# Patient Record
Sex: Male | Born: 1990 | Race: White | Hispanic: No | Marital: Single | Smoking: Never smoker
Health system: Southern US, Community
[De-identification: ages and names within clinical notes are randomized; demographics above are authoritative.]

## PROBLEM LIST (undated history)

## (undated) DIAGNOSIS — R519 Headache, unspecified: Secondary | ICD-10-CM

## (undated) HISTORY — PX: NO PAST SURGERIES: SHX2092

## (undated) HISTORY — DX: Headache, unspecified: R51.9

---
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
## 2013-07-31 ENCOUNTER — Emergency Department (HOSPITAL_COMMUNITY): Payer: BC Managed Care – PPO

## 2013-07-31 ENCOUNTER — Encounter (HOSPITAL_COMMUNITY): Payer: Self-pay | Admitting: Emergency Medicine

## 2013-07-31 ENCOUNTER — Emergency Department (HOSPITAL_COMMUNITY)
Admission: EM | Admit: 2013-07-31 | Discharge: 2013-07-31 | Disposition: A | Discharge status: Home / Self Care | Payer: BC Managed Care – PPO | Attending: Emergency Medicine | Admitting: Emergency Medicine

## 2013-07-31 DIAGNOSIS — K5289 Other specified noninfective gastroenteritis and colitis: Secondary | ICD-10-CM | POA: Insufficient documentation

## 2013-07-31 DIAGNOSIS — K529 Noninfective gastroenteritis and colitis, unspecified: Secondary | ICD-10-CM

## 2013-07-31 DIAGNOSIS — Z79899 Other long term (current) drug therapy: Secondary | ICD-10-CM | POA: Insufficient documentation

## 2013-07-31 LAB — BASIC METABOLIC PANEL
BUN: 16 mg/dL (ref 6–23)
CO2: 26 mEq/L (ref 19–32)
Calcium: 10.1 mg/dL (ref 8.4–10.5)
Creatinine, Ser: 0.86 mg/dL (ref 0.50–1.35)
GFR calc non Af Amer: >90 mL/min (ref >90)
Glucose, Bld: 88 mg/dL (ref 70–99)
Sodium: 137 mEq/L (ref 135–145)

## 2013-07-31 LAB — CBC
MCV: 83.9 fL (ref 78.0–100.0)
Platelets: 248 10*3/uL (ref 150–400)
RBC: 5.15 MIL/uL (ref 4.22–5.81)
RDW: 12.8 % (ref 11.5–15.5)
WBC: 12.9 10*3/uL — ABNORMAL HIGH (ref 4.0–10.5)

## 2013-07-31 LAB — HEPATIC FUNCTION PANEL
Alkaline Phosphatase: 70 U/L (ref 39–117)
Bilirubin, Direct: <0.1 mg/dL (ref 0.0–0.3)
Total Bilirubin: 0.4 mg/dL (ref 0.3–1.2)

## 2013-07-31 MED ORDER — CIPROFLOXACIN HCL 500 MG PO TABS
500.0000 mg | ORAL_TABLET | Freq: Once | ORAL | Status: AC
  Administered 2013-07-31: 500 mg via ORAL
  Filled 2013-07-31: qty 1

## 2013-07-31 MED ORDER — METRONIDAZOLE 500 MG PO TABS
500.0000 mg | ORAL_TABLET | Freq: Once | ORAL | Status: AC
  Administered 2013-07-31: 500 mg via ORAL
  Filled 2013-07-31: qty 1

## 2013-07-31 MED ORDER — METRONIDAZOLE 500 MG PO TABS: 500.0000 mg | ORAL_TABLET | Freq: Two times a day (BID) | ORAL | Status: DC

## 2013-07-31 MED ORDER — MORPHINE SULFATE 4 MG/ML IJ SOLN
4.0000 mg | Freq: Once | INTRAMUSCULAR | Status: AC
  Administered 2013-07-31: 4 mg via INTRAVENOUS
  Filled 2013-07-31: qty 1

## 2013-07-31 MED ORDER — IOHEXOL 300 MG/ML  SOLN
50.0000 mL | Freq: Once | INTRAMUSCULAR | Status: AC | PRN
  Administered 2013-07-31: 50 mL via ORAL

## 2013-07-31 MED ORDER — IOHEXOL 300 MG/ML  SOLN
100.0000 mL | Freq: Once | INTRAMUSCULAR | Status: AC | PRN
  Administered 2013-07-31: 100 mL via INTRAVENOUS

## 2013-07-31 MED ORDER — ONDANSETRON HCL 4 MG/2ML IJ SOLN
4.0000 mg | Freq: Once | INTRAMUSCULAR | Status: AC
  Administered 2013-07-31: 4 mg via INTRAVENOUS
  Filled 2013-07-31: qty 2

## 2013-07-31 MED ORDER — CIPROFLOXACIN HCL 500 MG PO TABS: 500.0000 mg | ORAL_TABLET | Freq: Two times a day (BID) | ORAL | Status: DC

## 2013-07-31 NOTE — ED Provider Notes (Signed)
CSN: [id number]     Arrival date & time 07/31/13  1304 History   First MD Initiated Contact with Patient 07/31/13 1319     Chief Complaint  Patient presents with  . Abdominal Pain   (Consider location/radiation/quality/duration/timing/severity/associated sxs/prior Treatment) Patient is a 22 y.o. male presenting with abdominal pain. The history is provided by the patient.  Abdominal Pain Pain location:  Suprapubic and RLQ Pain quality: cramping and sharp   Pain radiates to:  Does not radiate Pain severity:  Moderate Onset quality:  Gradual Timing:  Constant Progression:  Worsening Chronicity:  New Context: not alcohol use, not diet changes, not eating, not sick contacts, not suspicious food intake and not trauma   Relieved by:  Nothing Worsened by:  Nothing tried Ineffective treatments:  None tried Associated symptoms: diarrhea and nausea   Associated symptoms: no cough, no fever, no shortness of breath and no vomiting     History reviewed. No pertinent past medical history. History reviewed. No pertinent past surgical history. No family history on file. History  Substance Use Topics  . Smoking status: Never Smoker   . Smokeless tobacco: Not on file  . Alcohol Use: Yes     Comment: occasional     Review of Systems  Constitutional: Negative for fever.  Respiratory: Negative for cough and shortness of breath.   Gastrointestinal: Positive for nausea, abdominal pain and diarrhea. Negative for vomiting.  All other systems reviewed and are negative.    Allergies  Strawberry  Home Medications   Current Outpatient Rx  Name  Route  Sig  Dispense  Refill  . calcium carbonate (TUMS - DOSED IN MG ELEMENTAL CALCIUM) 500 MG chewable tablet   Oral   Chew 1 tablet by mouth as needed for heartburn.         Marland Kitchen ibuprofen (ADVIL,MOTRIN) 200 MG tablet   Oral   Take 400 mg by mouth every 6 (six) hours as needed for pain or headache.         . lisdexamfetamine (VYVANSE) 50 MG  capsule   Oral   Take 50 mg by mouth every morning.          BP 141/86  Pulse 73  Resp 17  SpO2 99% Physical Exam  Constitutional: He is oriented to person, place, and time. He appears well-developed and well-nourished. No distress.  HENT:  Head: Normocephalic and atraumatic.  Mouth/Throat: No oropharyngeal exudate.  Eyes: EOM are normal. Pupils are equal, round, and reactive to light.  Neck: Normal range of motion. Neck supple.  Cardiovascular: Normal rate and regular rhythm.  Exam reveals no friction rub.   No murmur heard. Pulmonary/Chest: Effort normal and breath sounds normal. No respiratory distress. He has no wheezes. He has no rales.  Abdominal: He exhibits no distension. There is tenderness (RLQ). There is no rebound.  Genitourinary:  Deferred  Musculoskeletal: Normal range of motion. He exhibits no edema.  Neurological: He is alert and oriented to person, place, and time.  Skin: He is not diaphoretic.    ED Course  Procedures (including critical care time) Labs Review Labs Reviewed  CBC  BASIC METABOLIC PANEL  HEPATIC FUNCTION PANEL   Imaging Review Ct Abdomen Pelvis W Contrast  07/31/2013   CLINICAL DATA:  Nausea and vomiting, onset today, right lower quadrant pain, question appendicitis  EXAM: CT ABDOMEN AND PELVIS WITH CONTRAST  TECHNIQUE: Multidetector CT imaging of the abdomen and pelvis was performed using the standard protocol following bolus administration of intravenous  contrast. Sagittal and coronal MPR images reconstructed from axial data set.  CONTRAST:  OMNIPAQUE IOHEXOL 300 MG/ML SOLN Dilute oral contrast.  COMPARISON:  Not  FINDINGS: Minimal dependent atelectasis in left lower lobe.  Liver, spleen, pancreas, kidneys, and adrenal glands normal appearance.  1.6 cm diameter splenule anterior to spleen.  Colon and distal small bowel unopacified and under distended at time of imaging.  Normal appendix.  Due to lack of adequate distention and  opacification, unable to completely exclude bowel wall thickening of the distal ileum.  Remaining bowel loops normal appearance.  No mass, adenopathy, free fluid or inflammatory process.  Bladder and ureters unremarkable.  No hernia or acute osseous findings.  IMPRESSION: No evidence of appendicitis.  Wall of the distal ileum is minimally prominent but this could be an artifact related to inadequate distention and non opacification; however, unable to exclude distal small bowel enteritis such as from infection or inflammatory bowel disease.  Remainder of exam unremarkable.   Electronically Signed   By: Ulyses Southward M.D.   On: 07/31/2013 16:34    MDM   1. Enteritis    17M with no prior medical history presents with RLQ, suprapubic pain. Denies GU symptoms. Mild associated nausea, no vomiting. Sent for concerns of appendicitis. Here, AFVSS, mild RLQ pain. Will CT scan.  Awaiting CT scan results, transfer of care to Dr. Jeraldine Loots.  Dagmar Hait, MD 08/01/13 [phone]

## 2013-07-31 NOTE — ED Provider Notes (Signed)
On repeat exam the patient appears comfortable.  He states that he feels better. I discussed the CT results with him and his mother. The mother now states that the patient has had similar episodes in the past, and has a cousin with inflammatory bowel disease. Patient has CT evidence of enteritis, and with his leukocytosis, and the mothers preference, he will be started on antibiotics.  He was discharged in stable condition to follow up with a primary care physician and gastroenterology.  Gerhard Munch, MD 07/31/13 1728

## 2013-07-31 NOTE — ED Notes (Signed)
While IB pushing morphine pt stated complaining of it making him feel worse rather than better.  Pt become pain hurting worse in abd and in his head. Stopped pushing and discarded rest of med.

## 2013-07-31 NOTE — ED Notes (Signed)
Pt states that early this morning he started having abdominal pain. Pt went to [facility] and was sent here to rule out appendicitis. Pt has had diarrhea and nausea. Pt denies vomiting.

## 2013-07-31 NOTE — ED Notes (Signed)
MD at bedside. 

## 2015-04-18 IMAGING — CT CT ABD-PELV W/ CM
1 of 2 series · 15 of 32 positions shown, 19 images · IV contrast (OMNIPAQUE 300)
Comparison: Not

CLINICAL DATA: Nausea and vomiting, onset today, right lower
quadrant pain, question appendicitis

EXAM:
CT ABDOMEN AND PELVIS WITH CONTRAST
TECHNIQUE: Multidetector CT imaging of the abdomen and pelvis was performed
using the standard protocol following bolus administration of
intravenous contrast. Sagittal and coronal MPR images reconstructed
from axial data set.
CONTRAST:  100mL OMNIPAQUE IOHEXOL 300 MG/ML SOLN Dilute oral
contrast.

[Series 2: abd/pel with · axial · 0.74mm/px · z∈[-442,-17]mm · 15 of 93 slices shown, 19 images]
[im 4/93  soft-tissue]
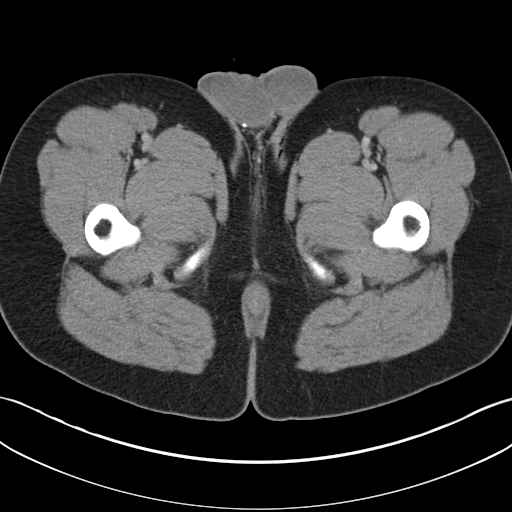
[im 4/93  bone]
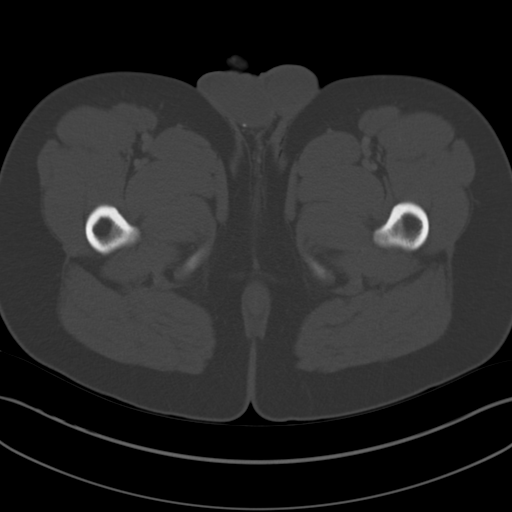
[im 12/93  soft-tissue]
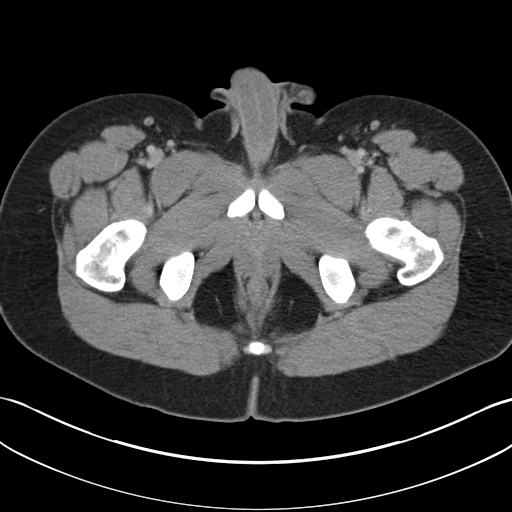
[im 20/93  soft-tissue]
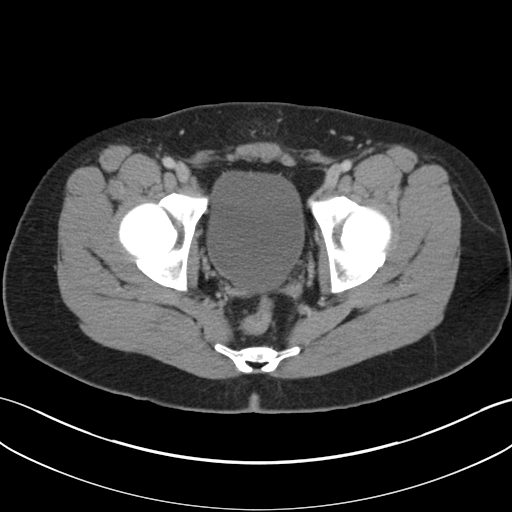
[im 27/93  soft-tissue]
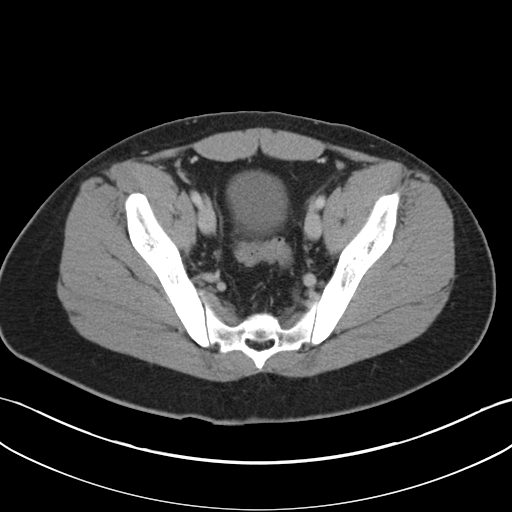
[im 31/93  soft-tissue]
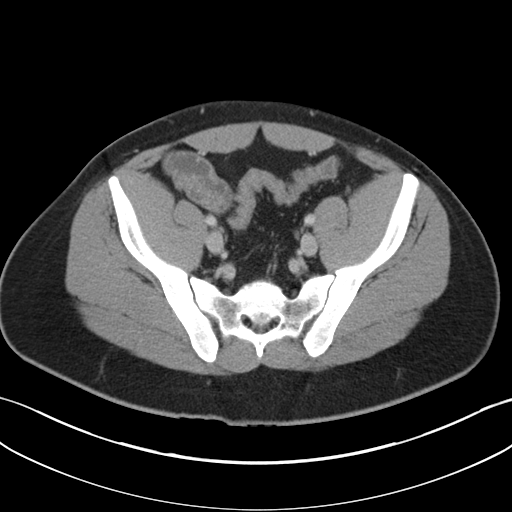
[im 39/93  soft-tissue]
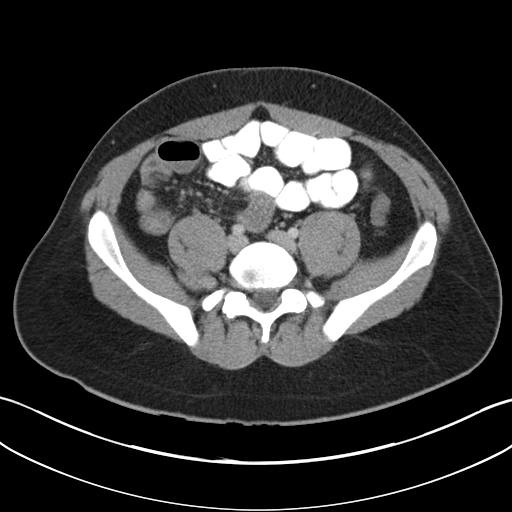
[im 47/93  soft-tissue]
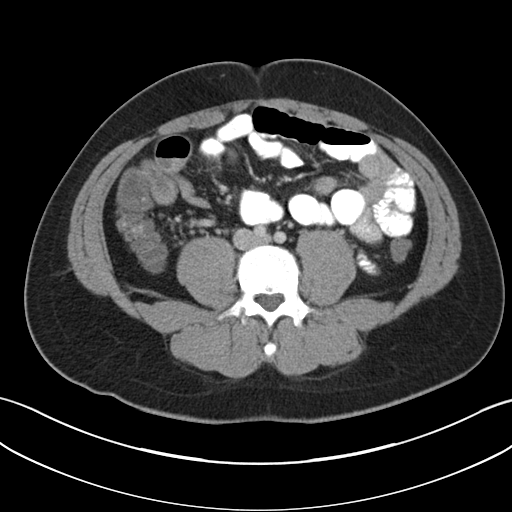
[im 54/93  soft-tissue]
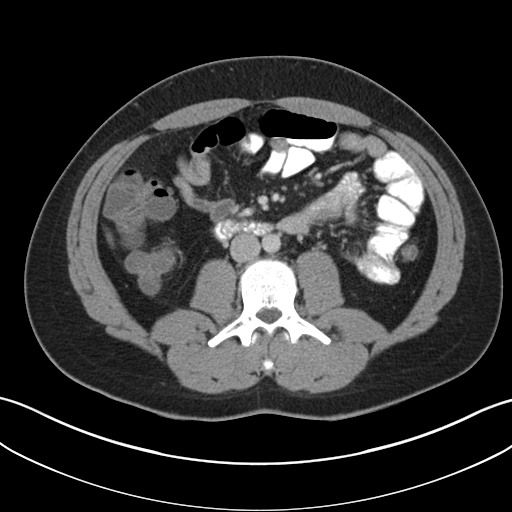
[im 62/93  soft-tissue]
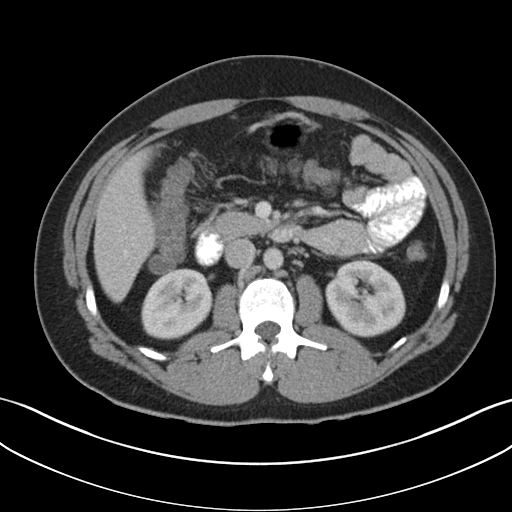
[im 62/93  bone]
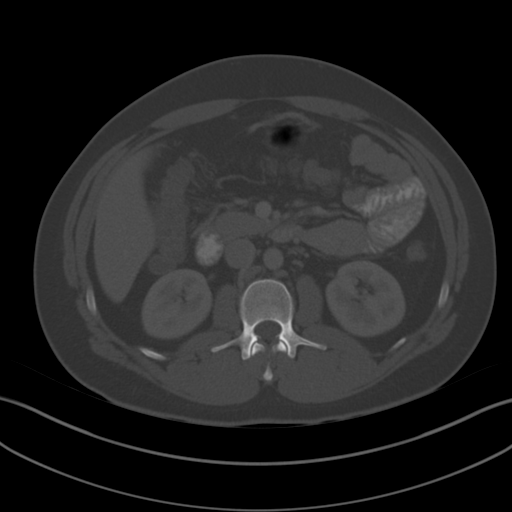
[im 66/93  soft-tissue]
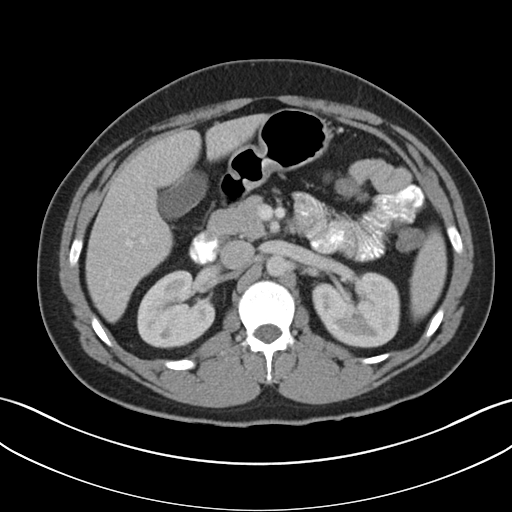
[im 73/93  soft-tissue]
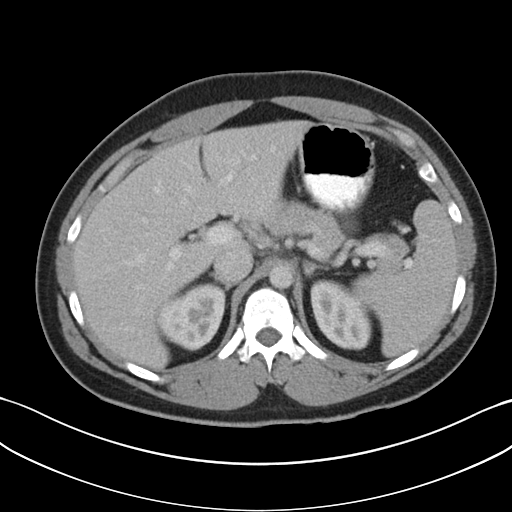
[im 77/93  lung]
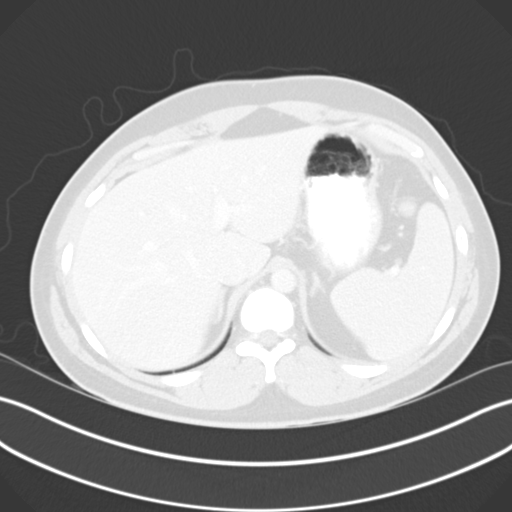
[im 81/93  soft-tissue]
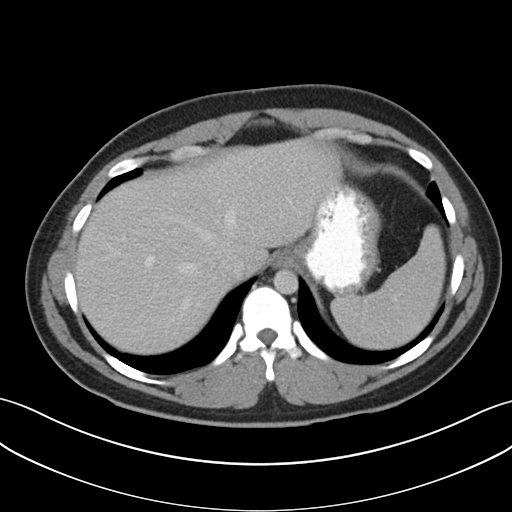
[im 81/93  lung]
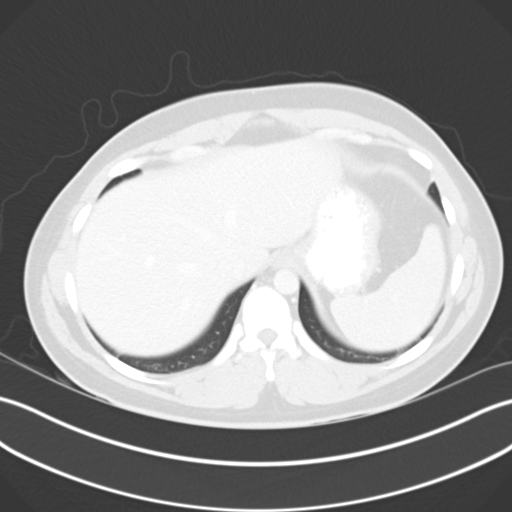
[im 85/93  lung]
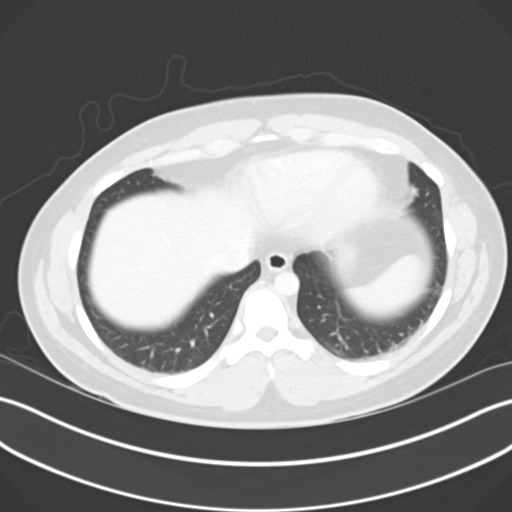
[im 89/93  soft-tissue]
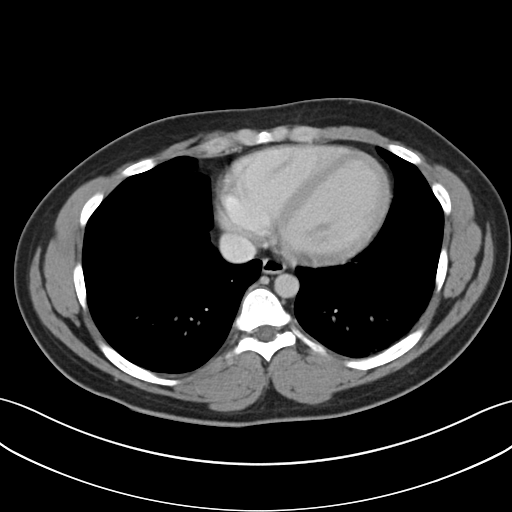
[im 89/93  lung]
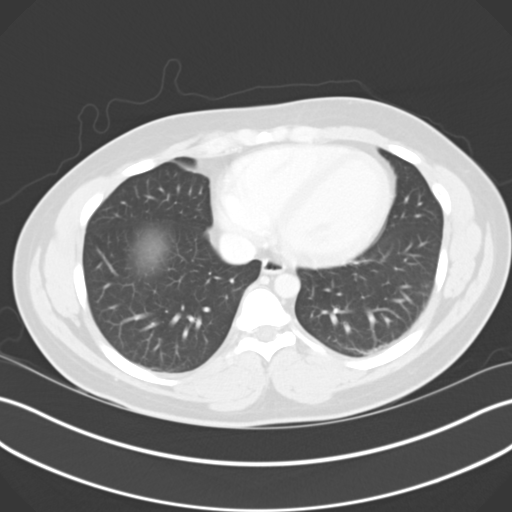

[15 of 32 positions shown; findings below may reference images not displayed]

FINDINGS: Minimal dependent atelectasis in left lower lobe.

Liver, spleen, pancreas, kidneys, and adrenal glands normal
appearance.

1.6 cm diameter splenule anterior to spleen.

Colon and distal small bowel unopacified and under distended at time
of imaging.

Normal appendix.

Due to lack of adequate distention and opacification, unable to
completely exclude bowel wall thickening of the distal ileum.

Remaining bowel loops normal appearance.

No mass, adenopathy, free fluid or inflammatory process.

Bladder and ureters unremarkable.

No hernia or acute osseous findings.
IMPRESSION: No evidence of appendicitis.

Wall of the distal ileum is minimally prominent but this could be an
artifact related to inadequate distention and non opacification;
however, unable to exclude distal small bowel enteritis such as from
infection or inflammatory bowel disease.

Remainder of exam unremarkable.

## 2017-02-06 DIAGNOSIS — H6120 Impacted cerumen, unspecified ear: Secondary | ICD-10-CM | POA: Diagnosis not present

## 2017-06-28 ENCOUNTER — Emergency Department (HOSPITAL_COMMUNITY)
Admission: EM | Admit: 2017-06-28 | Discharge: 2017-06-29 | Disposition: A | Discharge status: Home / Self Care | Payer: Commercial Managed Care - PPO | Attending: Emergency Medicine | Admitting: Emergency Medicine

## 2017-06-28 ENCOUNTER — Encounter (HOSPITAL_COMMUNITY): Payer: Self-pay

## 2017-06-28 DIAGNOSIS — Y929 Unspecified place or not applicable: Secondary | ICD-10-CM | POA: Diagnosis not present

## 2017-06-28 DIAGNOSIS — Y939 Activity, unspecified: Secondary | ICD-10-CM | POA: Insufficient documentation

## 2017-06-28 DIAGNOSIS — Y999 Unspecified external cause status: Secondary | ICD-10-CM | POA: Insufficient documentation

## 2017-06-28 DIAGNOSIS — T162XXA Foreign body in left ear, initial encounter: Secondary | ICD-10-CM

## 2017-06-28 DIAGNOSIS — W458XXA Other foreign body or object entering through skin, initial encounter: Secondary | ICD-10-CM | POA: Diagnosis not present

## 2017-06-28 DIAGNOSIS — S09302A Unspecified injury of left middle and inner ear, initial encounter: Secondary | ICD-10-CM | POA: Diagnosis present

## 2017-06-28 DIAGNOSIS — S00452A Superficial foreign body of left ear, initial encounter: Secondary | ICD-10-CM | POA: Diagnosis not present

## 2017-06-28 DIAGNOSIS — Z79899 Other long term (current) drug therapy: Secondary | ICD-10-CM | POA: Insufficient documentation

## 2017-06-28 MED ORDER — LIDOCAINE HCL (PF) 1 % IJ SOLN
INTRAMUSCULAR | Status: AC
  Filled 2017-06-28: qty 30

## 2017-06-28 MED ORDER — HYDROCODONE-ACETAMINOPHEN 5-325 MG PO TABS
1.0000 | ORAL_TABLET | Freq: Once | ORAL | Status: AC
Start: 2017-06-28 — End: 2017-06-28
  Administered 2017-06-28: 1 via ORAL
  Filled 2017-06-28: qty 1

## 2017-06-28 MED ORDER — LIDOCAINE HCL (PF) 1 % IJ SOLN: 2.0000 mL | Freq: Once | INTRAMUSCULAR | Status: DC

## 2017-06-28 NOTE — ED Notes (Signed)
Pt c/o insect in the left ear and "fluttering." Upon inspection, a small black looking insect was seen.

## 2017-06-28 NOTE — ED Triage Notes (Signed)
Pt felt an insect fly in to his ear about 30 mins ago. Insect visualized in L ear canal. Pt states that he feels fluttering. A&Ox4. No other complaints.

## 2017-06-28 NOTE — ED Notes (Signed)
Bed: WTR5 Expected date:  Expected time:  Means of arrival:  Comments: 

## 2017-06-29 DIAGNOSIS — T162XXA Foreign body in left ear, initial encounter: Secondary | ICD-10-CM | POA: Diagnosis not present

## 2017-06-29 DIAGNOSIS — Z79899 Other long term (current) drug therapy: Secondary | ICD-10-CM | POA: Diagnosis not present

## 2017-06-29 MED ORDER — HYDROCODONE-ACETAMINOPHEN 5-325 MG PO TABS: 1.0000 | ORAL_TABLET | Freq: Three times a day (TID) | ORAL | 0 refills | Status: AC | PRN

## 2017-06-29 MED ORDER — ACETAMINOPHEN 325 MG PO TABS
650.0000 mg | ORAL_TABLET | Freq: Once | ORAL | Status: AC
  Administered 2017-06-29: 650 mg via ORAL
  Filled 2017-06-29: qty 2

## 2017-06-29 MED ORDER — ACETAMINOPHEN 500 MG PO TABS: 1000.0000 mg | ORAL_TABLET | Freq: Three times a day (TID) | ORAL | 0 refills | Status: AC

## 2017-06-29 MED ORDER — OFLOXACIN 0.3 % OP SOLN
5.0000 [drp] | Freq: Every day | OPHTHALMIC | Status: DC
  Administered 2017-06-29: 5 [drp] via OTIC
  Filled 2017-06-29: qty 5

## 2017-06-29 NOTE — ED Provider Notes (Signed)
[facility]-EMERGENCY DEPT Provider Note   CSN: [id number] Arrival date & time: 06/28/17  2202     History   Chief Complaint Chief Complaint  Patient presents with  . Foreign Body in Ear    HPI Jesse Harmon is a 26 y.o. male.  The history is provided by the patient.  Foreign Body in Ear  This is a new problem. The current episode started less than 1 hour ago. The problem occurs constantly. The problem has not changed since onset.Pertinent negatives include no chest pain, no abdominal pain, no headaches and no shortness of breath. Nothing aggravates the symptoms. Nothing relieves the symptoms. He has tried nothing for the symptoms.   Patient believes it was a bug that flew into his ear. Feels it moving around.  History reviewed. No pertinent past medical history.  There are no active problems to display for this patient.   History reviewed. No pertinent surgical history.     Home Medications    Prior to Admission medications   Medication Sig Start Date End Date Taking? Authorizing Provider  calcium carbonate (TUMS - DOSED IN MG ELEMENTAL CALCIUM) 500 MG chewable tablet Chew 1 tablet by mouth as needed for heartburn.    [provider]  ciprofloxacin (CIPRO) 500 MG tablet Take 1 tablet (500 mg total) by mouth 2 (two) times daily. 07/31/13   Gerhard Munch, MD  ibuprofen (ADVIL,MOTRIN) 200 MG tablet Take 400 mg by mouth every 6 (six) hours as needed for pain or headache.    [provider]  lisdexamfetamine (VYVANSE) 50 MG capsule Take 50 mg by mouth every morning.    [provider]  metroNIDAZOLE (FLAGYL) 500 MG tablet Take 1 tablet (500 mg total) by mouth 2 (two) times daily. 07/31/13   Gerhard Munch, MD    Family History History reviewed. No pertinent family history.  Social History Social History  Substance Use Topics  . Smoking status: Never Smoker  . Smokeless tobacco: Not on file  . Alcohol use Yes     Comment: occasional       Allergies   Strawberry extract   Review of Systems Review of Systems  HENT: Positive for ear pain and hearing loss.   Respiratory: Negative for shortness of breath.   Cardiovascular: Negative for chest pain.  Gastrointestinal: Negative for abdominal pain.  Neurological: Negative for headaches.     Physical Exam Updated Vital Signs BP 139/84 (BP Location: Right Arm)   Pulse 68   Temp 98.1 F (36.7 C) (Oral)   Resp 16   Ht 5\' 7"  (1.702 m)   Wt 94.8 kg (209 lb)   SpO2 95%   BMI 32.73 kg/m   Physical Exam  Constitutional: He is oriented to person, place, and time. He appears well-developed and well-nourished. No distress.  HENT:  Head: Normocephalic and atraumatic.  Right Ear: Tympanic membrane and external ear normal.  Left Ear: External ear normal. There is swelling and tenderness. A foreign body (dark object, deep in the ear. ) is present. Tympanic membrane is perforated (appears to be perforated).  Nose: Nose normal.  Mouth/Throat: Mucous membranes are normal. No trismus in the jaw.  Eyes: Conjunctivae and EOM are normal. No scleral icterus.  Neck: Normal range of motion and phonation normal.  Cardiovascular: Normal rate and regular rhythm.   Pulmonary/Chest: Effort normal. No stridor. No respiratory distress.  Abdominal: He exhibits no distension.  Musculoskeletal: Normal range of motion. He exhibits no edema.  Neurological: He is alert  and oriented to person, place, and time.  Skin: He is not diaphoretic.  Psychiatric: He has a normal mood and affect. His behavior is normal.  Vitals reviewed.    ED Treatments / Results  Labs (all labs ordered are listed, but only abnormal results are displayed) Labs Reviewed - No data to display  EKG  EKG Interpretation None       Radiology No results found.  Procedures .Foreign Body Removal Date/Time: 06/29/2017 12:10 AM Performed by: Nira Conn Authorized by: Nira Conn  Consent:  Verbal consent obtained. Consent given by: patient Patient understanding: patient states understanding of the procedure being performed Patient identity confirmed: verbally with patient Time out: Immediately prior to procedure a "time out" was called to verify the correct patient, procedure, equipment, support staff and site/side marked as required. Body area: ear Location details: left ear  Sedation: Patient sedated: no Patient restrained: no Patient cooperative: yes Localization method: ENT speculum and magnification Removal mechanism: curette (lighted currete forceps) Complexity: complex Post-procedure assessment: foreign body not removed Patient tolerance: Patient tolerated the procedure well with no immediate complications Comments: Unable to remove FB   (including critical care time)  Medications Ordered in ED Medications  lidocaine (PF) (XYLOCAINE) 1 % injection 2 mL (not administered)  ofloxacin (OCUFLOX) 0.3 % ophthalmic solution 5 drop (not administered)  HYDROcodone-acetaminophen (NORCO/VICODIN) 5-325 MG per tablet 1 tablet (1 tablet Oral Given 06/28/17 2336)     Initial Impression / Assessment and Plan / ED Course  I have reviewed the triage vital signs and the nursing notes.  Pertinent labs & imaging results that were available during my care of the patient were reviewed by me and considered in my medical decision making (see chart for details).     Unsuccessful removal of foreign body. It appears that the TM is perforated. Patient provided with by mouth pain medication. We'll provide patient with ofloxacin eardrops. He requires urgent ENT follow-up for removal.  The patient is safe for discharge with strict return precautions.   Final Clinical Impressions(s) / ED Diagnoses   Final diagnoses:  Foreign body of left ear, initial encounter   Disposition: Discharge  Condition: Good  I have discussed the results, Dx and Tx plan with the patient who expressed  understanding and agree(s) with the plan. Discharge instructions discussed at great length. The patient was given strict return precautions who verbalized understanding of the instructions. No further questions at time of discharge.    New Prescriptions   No medications on file    Follow Up: Serena Colonel, MD [address] [place] [address] [phone]  Schedule an appointment as soon as possible for a visit  In 2-3 days for evaluation of left ear foreign body      Murat Rideout, Amadeo Garnet, MD 06/29/17 [phone]

## 2017-06-30 DIAGNOSIS — T162XXA Foreign body in left ear, initial encounter: Secondary | ICD-10-CM | POA: Diagnosis not present

## 2017-06-30 DIAGNOSIS — T161XXA Foreign body in right ear, initial encounter: Secondary | ICD-10-CM | POA: Insufficient documentation

## 2017-07-15 DIAGNOSIS — T161XXA Foreign body in right ear, initial encounter: Secondary | ICD-10-CM | POA: Diagnosis not present

## 2017-12-30 DIAGNOSIS — J209 Acute bronchitis, unspecified: Secondary | ICD-10-CM | POA: Diagnosis not present

## 2018-04-16 DIAGNOSIS — M25531 Pain in right wrist: Secondary | ICD-10-CM | POA: Diagnosis not present

## 2018-08-20 DIAGNOSIS — Z23 Encounter for immunization: Secondary | ICD-10-CM | POA: Diagnosis not present

## 2018-08-20 DIAGNOSIS — S86911A Strain of unspecified muscle(s) and tendon(s) at lower leg level, right leg, initial encounter: Secondary | ICD-10-CM | POA: Diagnosis not present

## 2019-09-01 ENCOUNTER — Encounter: Payer: Self-pay | Admitting: Gastroenterology

## 2019-10-06 ENCOUNTER — Ambulatory Visit: Payer: Commercial Managed Care - PPO | Admitting: Gastroenterology

## 2019-10-06 ENCOUNTER — Encounter (INDEPENDENT_AMBULATORY_CARE_PROVIDER_SITE_OTHER): Payer: Self-pay

## 2019-10-06 ENCOUNTER — Encounter: Payer: Self-pay | Admitting: Gastroenterology

## 2019-10-06 VITALS — BP 122/82 | HR 60 | Temp 97.6°F | Ht 66.0 in | Wt 184.0 lb

## 2019-10-06 DIAGNOSIS — K648 Other hemorrhoids: Secondary | ICD-10-CM

## 2019-10-06 NOTE — Patient Instructions (Signed)
If you are age 30 or older, your body mass index should be between 23-30. Your Body mass index is 29.7 kg/m. If this is out of the aforementioned range listed, please consider follow up with your Primary Care Provider.  If you are age 68 or younger, your body mass index should be between 19-25. Your Body mass index is 29.7 kg/m. If this is out of the aformentioned range listed, please consider follow up with your Primary Care Provider.   Please start 1 tablespoon of Citrucel fiber in a large glass of water daily.   If no improvement of symptoms or symptoms worsen, please call [phone]   It was a pleasure to see you today!  Dr. Loletha Carrow

## 2019-10-06 NOTE — Progress Notes (Signed)
[facility] Consult Note:  History: Jesse Harmon 10/06/2019  Referring provider: Patient, No Pcp Per  Reason for consult/chief complaint: Rectal Bleeding (HEMORRHOIDS )   Subjective  HPI:  This is a very pleasant 28 year old man self-referred for rectal bleeding.  He reports that it occurred several years ago, and his PCP at the time referred him to a local GI physician who did a colonoscopy.  He cannot recall who that was, believes it was 4 years ago and was told everything was "fine". He has continued to have intermittent rectal bleeding after bowel movements, often feels either incompletely evacuated or that there is more bleeding and he has to go back again shortly afterwards.  Sometimes he will sit for prolonged periods and strain for bowel movements.  Stool is typically formed, and he does not get diarrhea.  He tends toward abdominal bloating at times and his stomach sometimes makes a lot of noise after meals.  His father was also diagnosed with colon cancer in his early 3s.  ROS:  Review of Systems  Constitutional: Negative for appetite change and unexpected weight change.  HENT: Negative for mouth sores and voice change.   Eyes: Negative for pain and redness.  Respiratory: Negative for cough and shortness of breath.   Cardiovascular: Negative for chest pain and palpitations.  Genitourinary: Negative for dysuria and hematuria.  Musculoskeletal: Negative for arthralgias and myalgias.  Skin: Negative for pallor and rash.  Neurological: Negative for weakness and headaches.  Hematological: Negative for adenopathy.     Past Medical History: Past Medical History:  Diagnosis Date  . Headache      Past Surgical History: Past Surgical History:  Procedure Laterality Date  . NO PAST SURGERIES       Family History: Family History  Problem Relation Age of Onset  . Colon cancer Father   . Stomach cancer Neg Hx   . Rectal cancer Neg Hx   .  Esophageal cancer Neg Hx   . Pancreatic cancer Neg Hx     Social History: Social History   Socioeconomic History  . Marital status: Single    Spouse name: Not on file  . Number of children: Not on file  . Years of education: Not on file  . Highest education level: Not on file  Occupational History  . Occupation: LAB TECH    Comment: [employer]   Social Needs  . Financial resource strain: Not on file  . Food insecurity    Worry: Not on file    Inability: Not on file  . Transportation needs    Medical: Not on file    Non-medical: Not on file  Tobacco Use  . Smoking status: Never Smoker  . Smokeless tobacco: Never Used  Substance and Sexual Activity  . Alcohol use: Yes    Comment: occasional   . Drug use: No  . Sexual activity: Not on file  Lifestyle  . Physical activity    Days per week: Not on file    Minutes per session: Not on file  . Stress: Not on file  Relationships  . Social Herbalist on phone: Not on file    Gets together: Not on file    Attends religious service: Not on file    Active member of club or organization: Not on file    Attends meetings of clubs or organizations: Not on file    Relationship status: Not on file  Other Topics Concern  .  Not on file  Social History Narrative  . Not on file    Allergies: Allergies  Allergen Reactions  . Strawberry Extract Hives    Outpatient Meds: No current outpatient medications on file.   No current facility-administered medications for this visit.       ___________________________________________________________________ Objective   Exam:  BP 122/82   Pulse 60   Temp 97.6 F (36.4 C)   Ht 5\' 6"  (1.676 m)   Wt 184 lb (83.5 kg)   BMI 29.70 kg/m  Exam chaperoned by MA June  General: Well-appearing  Eyes: sclera anicteric, no redness  ENT: oral mucosa moist without lesions, no cervical or supraclavicular lymphadenopathy  CV: RRR without murmur, S1/S2, no JVD, no peripheral  edema  Resp: clear to auscultation bilaterally, normal RR and effort noted  GI: soft, no tenderness, with active bowel sounds. No guarding or palpable organomegaly noted.  Skin; warm and dry, no rash or jaundice noted  Neuro: awake, alert and oriented x 3. Normal gross motor function and fluent speech Rectal:  Normal external, normal DRE Anoscopy:  Internal HR, esp RP column    Assessment: Encounter Diagnosis  Name Primary?  . Bleeding internal hemorrhoids Yes    He has bleeding internal hemorrhoids.  The anatomy and physiology of hemorrhoids was reviewed along with diagrams.  By his report, he has already had a colonoscopy evaluation for this bleeding several years ago, presumably excluding other causes.  Anoscopy did not find any changes of proctitis, and he does not have chronic diarrhea.   Plan:  Daily fiber supplement, adequate water intake, regular toilet regimen. Hemorrhoidal banding was briefly discussed and a brochure was given. If he does not have improvement with the above measures, he will contact July and can be scheduled for hemorrhoidal banding.  Thank you for the courtesy of this consult.  Please call me with any questions or concerns.  [place]  CC: Referring provider noted above

## 2019-11-03 ENCOUNTER — Ambulatory Visit: Payer: Commercial Managed Care - PPO | Attending: Internal Medicine

## 2019-11-03 DIAGNOSIS — Z20822 Contact with and (suspected) exposure to covid-19: Secondary | ICD-10-CM

## 2019-11-04 LAB — NOVEL CORONAVIRUS, NAA: SARS-CoV-2, NAA: DETECTED — AB

## 2019-11-15 ENCOUNTER — Ambulatory Visit: Payer: Commercial Managed Care - PPO | Attending: Internal Medicine

## 2019-11-15 DIAGNOSIS — Z20822 Contact with and (suspected) exposure to covid-19: Secondary | ICD-10-CM

## 2019-11-16 LAB — NOVEL CORONAVIRUS, NAA: SARS-CoV-2, NAA: DETECTED — AB

## 2020-03-15 ENCOUNTER — Telehealth: Payer: Self-pay | Admitting: Gastroenterology

## 2020-03-15 NOTE — Telephone Encounter (Signed)
Pt calling interested in scheduling hem banding. Please advise if pt may be scheduled for banding.

## 2020-03-17 NOTE — Telephone Encounter (Signed)
Please schedule pt for banding, see note below from Dr. Myrtie Neither.

## 2020-03-17 NOTE — Telephone Encounter (Signed)
Patient is scheduled for May 27 for banding.

## 2020-03-17 NOTE — Telephone Encounter (Signed)
yes

## 2020-03-30 ENCOUNTER — Ambulatory Visit: Payer: Commercial Managed Care - PPO | Admitting: Gastroenterology

## 2020-03-30 ENCOUNTER — Encounter: Payer: Self-pay | Admitting: Gastroenterology

## 2020-03-30 DIAGNOSIS — K648 Other hemorrhoids: Secondary | ICD-10-CM

## 2020-03-30 NOTE — Progress Notes (Signed)
PROCEDURE NOTE: The patient presents with symptomatic grade 2 hemorrhoids, requesting rubber band ligation of his/her hemorrhoidal disease. All risks, benefits and alternative forms of therapy were described and informed consent was obtained.  DRE revealed: nml Anoscopy - 3 columns Int HR, RP most prominent  I showed him diagrams of the anatomy and how the procedures performed and all questions were answered.  The anorectum was pre-medicated with 0.125% NTG and lubricant. The decision was made to band the RP internal hemorrhoids, and the [facility] System was used to perform band ligation without complication. Digital anorectal examination was then performed to assure proper positioning of the band, and to adjust the banded tissue as required. The patient was discharged home without pain or other issues. Dietary and behavioral recommendations were given and along with follow-up instructions.   The following adjunctive treatments were recommended:  None (BMs normal)  The patient will return (few months when new insurance active - about to change jobs)  for follow-up and possible additional banding as required. No complications were encountered and the patient tolerated the procedure well.

## 2020-03-30 NOTE — Patient Instructions (Signed)
If you are age 31 or older, your body mass index should be between 23-30. Your Body mass index is 28.96 kg/m. If this is out of the aforementioned range listed, please consider follow up with your Primary Care Provider.  If you are age 63 or younger, your body mass index should be between 19-25. Your Body mass index is 28.96 kg/m. If this is out of the aformentioned range listed, please consider follow up with your Primary Care Provider.   HEMORRHOID BANDING PROCEDURE    FOLLOW-UP CARE   1. The procedure you have had should have been relatively painless since the banding of the area involved does not have nerve endings and there is no pain sensation.  The rubber band cuts off the blood supply to the hemorrhoid and the band may fall off as soon as 48 hours after the banding (the band may occasionally be seen in the toilet bowl following a bowel movement). You may notice a temporary feeling of fullness in the rectum which should respond adequately to plain Tylenol or Motrin.  2. Following the banding, avoid strenuous exercise that evening and resume full activity the next day.  A sitz bath (soaking in a warm tub) or bidet is soothing, and can be useful for cleansing the area after bowel movements.     3. To avoid constipation, take two tablespoons of natural wheat bran, natural oat bran, flax, Benefiber or any over the counter fiber supplement and increase your water intake to 7-8 glasses daily.    4. Unless you have been prescribed anorectal medication, do not put anything inside your rectum for two weeks: No suppositories, enemas, fingers, etc.  5. Occasionally, you may have more bleeding than usual after the banding procedure.  This is often from the untreated hemorrhoids rather than the treated one.  Don't be concerned if there is a tablespoon or so of blood.  If there is more blood than this, lie flat with your bottom higher than your head and apply an ice pack to the area. If the bleeding  does not stop within a half an hour or if you feel faint, call our office at [phone] or go to the emergency room.  6. Problems are not common; however, if there is a substantial amount of bleeding, severe pain, chills, fever or difficulty passing urine (very rare) or other problems, you should call us at [phone] or report to the nearest emergency room.  7. Do not stay seated continuously for more than 2-3 hours for a day or two after the procedure.  Tighten your buttock muscles 10-15 times every two hours and take 10-15 deep breaths every 1-2 hours.  Do not spend more than a few minutes on the toilet if you cannot empty your bowel; instead re-visit the toilet at a later time.    It was a pleasure to see you today!  Dr. Myrtie Neither

## 2020-12-07 DIAGNOSIS — U071 COVID-19: Secondary | ICD-10-CM | POA: Diagnosis not present

## 2020-12-07 DIAGNOSIS — Z20822 Contact with and (suspected) exposure to covid-19: Secondary | ICD-10-CM | POA: Diagnosis not present

## 2020-12-08 DIAGNOSIS — Z20822 Contact with and (suspected) exposure to covid-19: Secondary | ICD-10-CM | POA: Diagnosis not present

## 2021-03-29 DIAGNOSIS — M79644 Pain in right finger(s): Secondary | ICD-10-CM | POA: Diagnosis not present

## 2021-04-20 DIAGNOSIS — M79641 Pain in right hand: Secondary | ICD-10-CM | POA: Diagnosis not present

## 2021-04-26 DIAGNOSIS — M79641 Pain in right hand: Secondary | ICD-10-CM | POA: Diagnosis not present

## 2021-05-17 DIAGNOSIS — M79641 Pain in right hand: Secondary | ICD-10-CM | POA: Diagnosis not present

## 2021-05-21 DIAGNOSIS — J029 Acute pharyngitis, unspecified: Secondary | ICD-10-CM | POA: Diagnosis not present

## 2021-05-21 DIAGNOSIS — Z03818 Encounter for observation for suspected exposure to other biological agents ruled out: Secondary | ICD-10-CM | POA: Diagnosis not present

## 2021-05-21 DIAGNOSIS — B349 Viral infection, unspecified: Secondary | ICD-10-CM | POA: Diagnosis not present

## 2021-11-30 DIAGNOSIS — J209 Acute bronchitis, unspecified: Secondary | ICD-10-CM | POA: Diagnosis not present

## 2021-11-30 DIAGNOSIS — F902 Attention-deficit hyperactivity disorder, combined type: Secondary | ICD-10-CM | POA: Diagnosis not present

## 2021-12-31 DIAGNOSIS — E663 Overweight: Secondary | ICD-10-CM | POA: Diagnosis not present

## 2021-12-31 DIAGNOSIS — F988 Other specified behavioral and emotional disorders with onset usually occurring in childhood and adolescence: Secondary | ICD-10-CM | POA: Diagnosis not present

## 2022-02-08 DIAGNOSIS — L918 Other hypertrophic disorders of the skin: Secondary | ICD-10-CM | POA: Diagnosis not present

## 2022-02-12 DIAGNOSIS — E663 Overweight: Secondary | ICD-10-CM | POA: Diagnosis not present

## 2022-03-25 DIAGNOSIS — R1033 Periumbilical pain: Secondary | ICD-10-CM | POA: Diagnosis not present

## 2022-03-25 DIAGNOSIS — K625 Hemorrhage of anus and rectum: Secondary | ICD-10-CM | POA: Diagnosis not present

## 2022-04-22 DIAGNOSIS — R5383 Other fatigue: Secondary | ICD-10-CM | POA: Diagnosis not present

## 2022-04-22 DIAGNOSIS — N529 Male erectile dysfunction, unspecified: Secondary | ICD-10-CM | POA: Diagnosis not present

## 2022-04-23 ENCOUNTER — Ambulatory Visit: Payer: BC Managed Care – PPO | Admitting: Gastroenterology

## 2022-04-23 ENCOUNTER — Encounter: Payer: Self-pay | Admitting: Gastroenterology

## 2022-04-23 VITALS — BP 130/90 | HR 53 | Resp 16 | Ht 66.0 in | Wt 181.8 lb

## 2022-04-23 DIAGNOSIS — K648 Other hemorrhoids: Secondary | ICD-10-CM

## 2022-04-23 NOTE — Progress Notes (Signed)
     [facility] Progress Note  Chief Complaint: Bleeding internal hemorrhoids  Subjective  History: Jesse Harmon was seen in December 2020 for internal hemorrhoidal bleeding discovered on anoscopy that day (previous colonoscopy at outside clinic years prior).  He had an office banding of the RP internal hemorrhoid column in late May 2021.  He tells me the bleeding stopped for about 18 months, and he had slow recurrence of intermittent painless rectal bleeding for the last 5 to 6 months.  No abdominal pain, bowel habits regular without straining  ROS: Cardiovascular:  no chest pain Respiratory: no dyspnea  The patient's Past Medical, Family and Social History were reviewed and are on file in the EMR.  Objective:  Med list reviewed  Current Outpatient Medications:    VYVANSE 20 MG capsule, Take 20 mg by mouth every morning., Disp: , Rfl:    Vital signs in last 24 hrs: Vitals:   04/23/22 1521  BP: 130/90  Pulse: (!) 53  Resp: 16  SpO2: 97%   Wt Readings from Last 3 Encounters:  04/23/22 181 lb 12.8 oz (82.5 kg)  03/30/20 179 lb 6.4 oz (81.4 kg)  10/06/19 184 lb (83.5 kg)    Physical Exam  Well-appearing  Cardiac: Regular without murmur,  no peripheral edema Pulm: clear to auscultation bilaterally, normal RR and effort noted Abdomen: soft, no tenderness, with active bowel sounds. No guarding or palpable hepatosplenomegaly. Normal perianal exam, normal DRE without fissure tenderness or palpable internal lesion. Anoscopy with 3 columns internal hemorrhoids, larger on the right  Labs:   ___________________________________________ Radiologic studies:   ____________________________________________ Other:   _____________________________________________ Assessment & Plan  Assessment: Encounter Diagnosis  Name Primary?   Bleeding internal hemorrhoids Yes   Internal hemorrhoidal bleeding.  He did not have a full series of banding treatments in the past and still  has a significantly enlarged RP column that have been banded before.  We talked about the options of banding (typically 3 sessions) versus colorectal surgery to consider surgical procedure.  He opted for banding but did not feel ready to do that today Plan: He has an upcoming appointment for hemorrhoidal banding.   Jesse Harmon

## 2022-04-23 NOTE — Patient Instructions (Signed)
If you are age 33 or older, your body mass index should be between 23-30. Your Body mass index is 29.34 kg/m. If this is out of the aforementioned range listed, please consider follow up with your Primary Care Provider.  If you are age 6 or younger, your body mass index should be between 19-25. Your Body mass index is 29.34 kg/m. If this is out of the aformentioned range listed, please consider follow up with your Primary Care Provider.   ________________________________________________________  The [facility] providers would like to encourage you to use [facility] to communicate with providers for non-urgent requests or questions.  Due to long hold times on the telephone, sending your provider a message by [facility] may be a faster and more efficient way to get a response.  Please allow 48 business hours for a response.  Please remember that this is for non-urgent requests.  _______________________________________________________  It was a pleasure to see you today!  Thank you for trusting me with your gastrointestinal care!

## 2022-05-06 DIAGNOSIS — K921 Melena: Secondary | ICD-10-CM | POA: Insufficient documentation

## 2022-05-06 DIAGNOSIS — N529 Male erectile dysfunction, unspecified: Secondary | ICD-10-CM | POA: Insufficient documentation

## 2022-05-06 DIAGNOSIS — F988 Other specified behavioral and emotional disorders with onset usually occurring in childhood and adolescence: Secondary | ICD-10-CM | POA: Insufficient documentation

## 2022-05-08 ENCOUNTER — Ambulatory Visit: Payer: BC Managed Care – PPO | Admitting: Gastroenterology

## 2022-09-02 DIAGNOSIS — M25532 Pain in left wrist: Secondary | ICD-10-CM | POA: Diagnosis not present

## 2022-09-02 DIAGNOSIS — M79641 Pain in right hand: Secondary | ICD-10-CM | POA: Diagnosis not present

## 2023-07-03 DIAGNOSIS — M25572 Pain in left ankle and joints of left foot: Secondary | ICD-10-CM | POA: Diagnosis not present

## 2023-07-03 DIAGNOSIS — S99912A Unspecified injury of left ankle, initial encounter: Secondary | ICD-10-CM | POA: Diagnosis not present

## 2023-07-03 DIAGNOSIS — S93402A Sprain of unspecified ligament of left ankle, initial encounter: Secondary | ICD-10-CM | POA: Diagnosis not present

## 2023-08-20 ENCOUNTER — Encounter: Payer: Self-pay | Admitting: Gastroenterology

## 2023-08-20 ENCOUNTER — Ambulatory Visit: Payer: BC Managed Care – PPO | Admitting: Gastroenterology

## 2023-08-20 VITALS — BP 108/72 | HR 80 | Ht 66.0 in | Wt 182.5 lb

## 2023-08-20 DIAGNOSIS — K648 Other hemorrhoids: Secondary | ICD-10-CM | POA: Diagnosis not present

## 2023-08-20 NOTE — Patient Instructions (Addendum)
We have you scheduled for a second hem banding on 09/16/2023 at 11:20am.   HEMORRHOID BANDING PROCEDURE    FOLLOW-UP CARE   The procedure you have had should have been relatively painless since the banding of the area involved does not have nerve endings and there is no pain sensation.  The rubber band cuts off the blood supply to the hemorrhoid and the band may fall off as soon as 48 hours after the banding (the band may occasionally be seen in the toilet bowl following a bowel movement). You may notice a temporary feeling of fullness in the rectum which should respond adequately to plain Tylenol or Motrin.  Following the banding, avoid strenuous exercise that evening and resume full activity the next day.  A sitz bath (soaking in a warm tub) or bidet is soothing, and can be useful for cleansing the area after bowel movements.     To avoid constipation, take two tablespoons of natural wheat bran, natural oat bran, flax, Benefiber or any over the counter fiber supplement and increase your water intake to 7-8 glasses daily.    Unless you have been prescribed anorectal medication, do not put anything inside your rectum for two weeks: No suppositories, enemas, fingers, etc.  Occasionally, you may have more bleeding than usual after the banding procedure.  This is often from the untreated hemorrhoids rather than the treated one.  Don't be concerned if there is a tablespoon or so of blood.  If there is more blood than this, lie flat with your bottom higher than your head and apply an ice pack to the area. If the bleeding does not stop within a half an hour or if you feel faint, call our office at [phone] or go to the emergency room.  Problems are not common; however, if there is a substantial amount of bleeding, severe pain, chills, fever or difficulty passing urine (very rare) or other problems, you should call us at [phone] or report to the nearest emergency room.  Do not stay  seated continuously for more than 2-3 hours for a day or two after the procedure.  Tighten your buttock muscles 10-15 times every two hours and take 10-15 deep breaths every 1-2 hours.  Do not spend more than a few minutes on the toilet if you cannot empty your bowel; instead re-visit the toilet at a later time.     _______________________________________________________  If your blood pressure at your visit was 140/90 or greater, please contact your primary care physician to follow up on this.  _______________________________________________________  If you are age 92 or older, your body mass index should be between 23-30. Your Body mass index is 29.46 kg/m. If this is out of the aforementioned range listed, please consider follow up with your Primary Care Provider.  If you are age 33 or younger, your body mass index should be between 19-25. Your Body mass index is 29.46 kg/m. If this is out of the aformentioned range listed, please consider follow up with your Primary Care Provider.   ________________________________________________________  The [place] [facility] providers would like to encourage you to use [facility] to communicate with providers for non-urgent requests or questions.  Due to long hold times on the telephone, sending your provider a message by [facility] may be a faster and more efficient way to get a response.  Please allow 48 business hours for a response.  Please remember that this is for non-urgent requests.  _______________________________________________________ It was a pleasure  to see you today!  Thank you for trusting me with your gastrointestinal care!

## 2023-08-20 NOTE — Progress Notes (Signed)
     [facility] Progress Note  Chief Complaint: Bleeding internal hemorrhoids  Subjective  History: Jesse Harmon was seen in December 2020 for internal hemorrhoidal bleeding discovered on anoscopy that day (previous colonoscopy at outside clinic years prior).  He had an office banding of the RP internal hemorrhoid column in late May 2021.  He did not have a full series of banding done, then returned for rectal bleeding in June 2023, anoscopy that visit showed a large hemorrhoidal columns in the RP, RA and LL distribution.  He was set up for banding appointment the following month but canceled for work and schedule reasons. He has continued to have frequent hemorrhoidal bleeding that has become an increasing nuisance.  He expressed wish to have hemorrhoidal banding performed today.  ROS: Cardiovascular:  no chest pain Respiratory: no dyspnea  The patient's Past Medical, Family and Social History were reviewed and are on file in the EMR.  Objective:  Med list reviewed No current outpatient medications on file.   Vital signs in last 24 hrs: Vitals:   08/20/23 1522  BP: 108/72  Pulse: 80  SpO2: 94%   Wt Readings from Last 3 Encounters:  08/20/23 182 lb 8 oz (82.8 kg)  04/23/22 181 lb 12.8 oz (82.5 kg)  03/30/20 179 lb 6.4 oz (81.4 kg)    Physical Exam  Well-appearing Abdomen soft nondistended nontender Anoscopy reveals large swollen internal hemorrhoidal plexus, RP and RA greater than LL   We discussed hemorrhoidal banding and how will typically require 3 or sometimes 4 sessions for complete eradication.  He was agreeable and wished to proceed after reviewing and signing the consent form.  PROCEDURE NOTE: The patient presents with symptomatic grade 2  hemorrhoids, requesting rubber band ligation of his/her hemorrhoidal disease.  All risks, benefits and alternative forms of therapy were described and informed consent was obtained.   The anorectum was pre-medicated with  0.125% NTG and lubricant. The decision was made to band the RP internal hemorrhoids, and the [facility] O'Regan System was used to perform band ligation without complication.  Digital anorectal examination was then performed to assure proper positioning of the band, and to adjust the banded tissue as required.  The patient was discharged home without pain or other issues.  Dietary and behavioral recommendations were given and along with follow-up instructions.     The following adjunctive treatments were recommended:  None  The patient will return 3 to 4 weeks for  follow-up and possible additional banding as required. No complications were encountered and the patient tolerated the procedure well.  Jesse Jupiter, MD   _____________________________________________ Assessment & Plan  Assessment: Encounter Diagnosis  Name Primary?   Bleeding internal hemorrhoids Yes     Jesse Harmon III

## 2023-08-21 ENCOUNTER — Other Ambulatory Visit: Payer: Self-pay | Admitting: Gastroenterology

## 2023-08-21 NOTE — Progress Notes (Signed)
ERROR

## 2023-09-16 ENCOUNTER — Encounter: Payer: BC Managed Care – PPO | Admitting: Gastroenterology

## 2023-10-06 ENCOUNTER — Telehealth: Payer: Self-pay | Admitting: Gastroenterology

## 2023-10-06 NOTE — Telephone Encounter (Signed)
PT wants to schedule a hem banding with Dr. Myrtie Neither. Unfortunately, he is booked. PT would like for Dr. Myrtie Neither to give recommendations for another provider to do it. Please advise.

## 2023-10-07 NOTE — Telephone Encounter (Signed)
Pt requested to schedule a hemorrhoid banding.  Pt stated that he had to cancel the last appointment that was scheduled on 09/16/2023 that was scheduled due to the fact that he did not make that appointment and that it is hard for him to get off work. Pt requested a Friday. Pt was notified that there is an availability on 11/14/2023 at 2:20 ( 7 day Hold) Pt was notified that I would contact him on 11/07/2023 to book and confirm appointment. Pt verbalized understanding with all questions answered.  Reminder placed in EPIC

## 2023-10-07 NOTE — Telephone Encounter (Signed)
Unable to reach pt or leave voice mail due to voice mailbox being full at this time.

## 2023-10-14 ENCOUNTER — Other Ambulatory Visit: Payer: Self-pay

## 2023-11-07 NOTE — Telephone Encounter (Signed)
 Confirmed appointment with patient for 11/14/23 at 2:20 pm with Dr. Myrtie Neither.

## 2023-11-14 ENCOUNTER — Ambulatory Visit: Payer: BC Managed Care – PPO | Admitting: Gastroenterology

## 2023-11-14 ENCOUNTER — Encounter: Payer: Self-pay | Admitting: Gastroenterology

## 2023-11-14 VITALS — BP 120/80 | HR 81 | Ht 66.0 in | Wt 182.0 lb

## 2023-11-14 DIAGNOSIS — K648 Other hemorrhoids: Secondary | ICD-10-CM

## 2023-11-14 DIAGNOSIS — K641 Second degree hemorrhoids: Secondary | ICD-10-CM | POA: Diagnosis not present

## 2023-11-14 NOTE — Progress Notes (Signed)
  Reports that bleeding is about 20% of what it was before the first banding.   PROCEDURE NOTE: The patient presents with symptomatic grade 2  hemorrhoids, requesting rubber band ligation of his/her hemorrhoidal disease.  All risks, benefits and alternative forms of therapy were described and informed consent was obtained.  DRE revealed: nml   The anorectum was pre-medicated with 0.125% NTG and lubricant. The decision was made to band the RA internal hemorrhoids, and the [facility] O'Regan System was used to perform band ligation without complication.  Digital anorectal examination was then performed to assure proper positioning of the band, and to adjust the banded tissue as required.  The patient was discharged home without pain or other issues.  Dietary and behavioral recommendations were given and along with follow-up instructions.     The following adjunctive treatments were recommended:  none  The patient will return 2-4 weeks  for  follow-up and possible additional banding as required. No complications were encountered and the patient tolerated the procedure well.  GLENWOOD Victory Brand, MD

## 2023-11-20 DIAGNOSIS — R062 Wheezing: Secondary | ICD-10-CM | POA: Diagnosis not present

## 2023-11-20 DIAGNOSIS — B974 Respiratory syncytial virus as the cause of diseases classified elsewhere: Secondary | ICD-10-CM | POA: Diagnosis not present

## 2023-11-20 DIAGNOSIS — R059 Cough, unspecified: Secondary | ICD-10-CM | POA: Diagnosis not present

## 2023-11-20 DIAGNOSIS — Z03818 Encounter for observation for suspected exposure to other biological agents ruled out: Secondary | ICD-10-CM | POA: Diagnosis not present

## 2024-04-28 ENCOUNTER — Ambulatory Visit
Admission: EM | Admit: 2024-04-28 | Discharge: 2024-04-28 | Disposition: A | Discharge status: Home / Self Care | Attending: Family Medicine | Admitting: Family Medicine

## 2024-04-28 DIAGNOSIS — L03011 Cellulitis of right finger: Secondary | ICD-10-CM

## 2024-04-28 DIAGNOSIS — L723 Sebaceous cyst: Secondary | ICD-10-CM

## 2024-04-28 MED ORDER — DOXYCYCLINE HYCLATE 100 MG PO CAPS: 100.0000 mg | ORAL_CAPSULE | Freq: Two times a day (BID) | ORAL | 0 refills | Status: DC

## 2024-04-28 MED ORDER — CLINDAMYCIN HCL 300 MG PO CAPS: 300.0000 mg | ORAL_CAPSULE | Freq: Three times a day (TID) | ORAL | 0 refills | Status: DC

## 2024-04-28 NOTE — ED Provider Notes (Signed)
 [facility]  Note:  This document was prepared using Conservation officer, historic buildings and may include unintentional dictation errors.  MRN: [id number] DOB: 1991-08-01  Subjective:   Jesse Harmon is a 33 y.o. male presenting for 2-week history of persistent right thumb pain, redness.  Patient has lanced the area 3 times incontinent to drain but is not achieving resolution.  Also has 72-month history of a persistent bump over the skin of his right upper back.  No fever, drainage of pus, bleeding, rash, history of skin cancer.  No current facility-administered medications for this encounter. No current outpatient medications on file.   No Known Allergies  Past Medical History:  Diagnosis Date   Headache      Past Surgical History:  Procedure Laterality Date   NO PAST SURGERIES      Family History  Problem Relation Age of Onset   Colon cancer Father    Stomach cancer Neg Hx    Rectal cancer Neg Hx    Esophageal cancer Neg Hx    Pancreatic cancer Neg Hx     Social History   Tobacco Use   Smoking status: Never   Smokeless tobacco: Never  Vaping Use   Vaping status: Never Used  Substance Use Topics   Alcohol use: Yes    Comment: occasional    Drug use: No    ROS   Objective:   Vitals: BP (!) 130/91 (BP Location: Left Arm)   Pulse 63   Temp 98.7 F (37.1 C) (Oral)   Resp 18   Ht 5' 6 (1.676 m)   Wt 178 lb (80.7 kg)   SpO2 96%   BMI 28.73 kg/m   Physical Exam Constitutional:      General: He is not in acute distress.    Appearance: Normal appearance. He is well-developed and normal weight. He is not ill-appearing, toxic-appearing or diaphoretic.  HENT:     Head: Normocephalic and atraumatic.     Right Ear: External ear normal.     Left Ear: External ear normal.     Nose: Nose normal.     Mouth/Throat:     Pharynx: Oropharynx is clear.   Eyes:     General: No scleral icterus.       Right eye: No discharge.        Left  eye: No discharge.     Extraocular Movements: Extraocular movements intact.    Cardiovascular:     Rate and Rhythm: Normal rate.  Pulmonary:     Effort: Pulmonary effort is normal.   Musculoskeletal:       Hands:     Cervical back: Normal range of motion.       Back:   Neurological:     Mental Status: He is alert and oriented to person, place, and time.   Psychiatric:        Mood and Affect: Mood normal.        Behavior: Behavior normal.        Thought Content: Thought content normal.        Judgment: Judgment normal.     Assessment and Plan :   PDMP not reviewed this encounter.  1. Paronychia of thumb, right   2. Sebaceous cyst    Start clindamycin for paronychia of the right thumb.  Referral placed to dermatology for suspected sebaceous cyst of the right upper back versus lipoma. Counseled patient on potential for adverse effects with medications prescribed/recommended today,  ER and return-to-clinic precautions discussed, patient verbalized understanding.    Christopher Savannah, [place] 04/28/24 1735

## 2024-04-28 NOTE — ED Triage Notes (Signed)
 Pt states that he has some redness of his right thumb x2 weeks  Pt states that he also had a bump on his back. X2-3 months

## 2024-06-10 ENCOUNTER — Ambulatory Visit
Admission: EM | Admit: 2024-06-10 | Discharge: 2024-06-10 | Disposition: A | Discharge status: Home / Self Care | Attending: Family Medicine | Admitting: Family Medicine

## 2024-06-10 DIAGNOSIS — L03011 Cellulitis of right finger: Secondary | ICD-10-CM | POA: Diagnosis not present

## 2024-06-10 MED ORDER — CLINDAMYCIN HCL 300 MG PO CAPS: 300.0000 mg | ORAL_CAPSULE | Freq: Three times a day (TID) | ORAL | 0 refills | Status: AC

## 2024-06-10 NOTE — ED Provider Notes (Signed)
 [facility]  Note:  This document was prepared using Conservation officer, historic buildings and may include unintentional dictation errors.  MRN: [id number] DOB: 03/10/1991  Subjective:   Jesse Harmon is a 33 y.o. male presenting for 1 week history of persistent worsening redness with swelling around the tissue of his right middle finger bordering the nail.  Believes he had a hangnail over the area.  Recently had a paronychia that resolved on its own without incision and drainage but he did take antibiotics for it.  This particular area is not resolving at all.  No current facility-administered medications for this encounter.  Current Outpatient Medications:    clindamycin  (CLEOCIN ) 300 MG capsule, Take 1 capsule (300 mg total) by mouth 3 (three) times daily., Disp: 30 capsule, Rfl: 0   No Known Allergies  Past Medical History:  Diagnosis Date   Headache      Past Surgical History:  Procedure Laterality Date   NO PAST SURGERIES      Family History  Problem Relation Age of Onset   Colon cancer Father    Stomach cancer Neg Hx    Rectal cancer Neg Hx    Esophageal cancer Neg Hx    Pancreatic cancer Neg Hx     Social History   Tobacco Use   Smoking status: Never   Smokeless tobacco: Never  Vaping Use   Vaping status: Never Used  Substance Use Topics   Alcohol use: Yes    Comment: occasional    Drug use: No    ROS   Objective:   Vitals: BP 133/89 (BP Location: Right Arm)   Pulse 73   Temp 99.1 F (37.3 C) (Oral)   Resp 16   SpO2 97%   Physical Exam Constitutional:      General: He is not in acute distress.    Appearance: Normal appearance. He is well-developed and normal weight. He is not ill-appearing, toxic-appearing or diaphoretic.  HENT:     Head: Normocephalic and atraumatic.     Right Ear: External ear normal.     Left Ear: External ear normal.     Nose: Nose normal.     Mouth/Throat:     Pharynx: Oropharynx is clear.   Eyes:     General: No scleral icterus.       Right eye: No discharge.        Left eye: No discharge.     Extraocular Movements: Extraocular movements intact.  Cardiovascular:     Rate and Rhythm: Normal rate.  Pulmonary:     Effort: Pulmonary effort is normal.  Musculoskeletal:       Hands:     Cervical back: Normal range of motion.     Comments: Paronychia over area outlined.  Neurological:     Mental Status: He is alert and oriented to person, place, and time.  Psychiatric:        Mood and Affect: Mood normal.        Behavior: Behavior normal.        Thought Content: Thought content normal.        Judgment: Judgment normal.    PROCEDURE NOTE: Paronychia I&D Verbal consent obtained. Local anesthesia using a digital block with 4cc of 1% lidocaine  without epinephrine. Site cleansed with Betadine.  Paronychia expressed using Adson forcep, #11 blade, discharge 1cc mixture of purulence, serosanguineous fluid. Cleansed and dressed.   Assessment and Plan :   PDMP not reviewed this encounter.  1. Paronychia of right middle finger    Successful I&D performed.  Wound care reviewed.  Start clindamycin  for the paronychia, ibuprofen for pain and inflammation. Counseled patient on potential for adverse effects with medications prescribed/recommended today, ER and return-to-clinic precautions discussed, patient verbalized understanding.    Christopher Savannah, [place] 06/10/24 8075

## 2024-06-10 NOTE — ED Triage Notes (Addendum)
 Pt c/o redness, swelling around right middle finger nail x 1 week-states he feels is r/t to a hang nail-started taking leftover abx from previous thumb paronychia- NAD-steady gait

## 2024-06-10 NOTE — Discharge Instructions (Signed)
 Change your dressing tomorrow. In general, after you remove your dressings, use warm soapy soaks and compression to express pus and infection material. Dry it afterwards, cover with non-stick gauze if possible. Secure with Coban. Change dressing 3-5 times daily depending on how much and how dirty your hands get. Start clindamycin  for the infection, 3 times daily with food for 10 days. Use ibuprofen for pain and inflammation.
